# Patient Record
Sex: Female | Born: 1980 | Race: Black or African American | Hispanic: No | Smoking: Current every day smoker
Health system: Southern US, Community
[De-identification: ages and names within clinical notes are randomized; demographics above are authoritative.]

## PROBLEM LIST (undated history)

## (undated) HISTORY — PX: CHOLECYSTECTOMY: SHX55

---

## 2004-05-28 ENCOUNTER — Inpatient Hospital Stay (HOSPITAL_COMMUNITY): Admission: AD | Admit: 2004-05-28 | Discharge: 2004-05-28 | Payer: Self-pay | Admitting: Obstetrics and Gynecology

## 2004-09-16 ENCOUNTER — Ambulatory Visit: Payer: Self-pay | Admitting: Family Medicine

## 2004-09-16 ENCOUNTER — Inpatient Hospital Stay (HOSPITAL_COMMUNITY): Admission: AD | Admit: 2004-09-16 | Discharge: 2004-09-16 | Payer: Self-pay | Admitting: Obstetrics & Gynecology

## 2004-12-16 ENCOUNTER — Ambulatory Visit: Payer: Self-pay | Admitting: *Deleted

## 2004-12-24 ENCOUNTER — Ambulatory Visit (HOSPITAL_COMMUNITY): Admission: RE | Admit: 2004-12-24 | Discharge: 2004-12-24 | Payer: Self-pay | Admitting: *Deleted

## 2004-12-24 ENCOUNTER — Ambulatory Visit: Payer: Self-pay | Admitting: Obstetrics & Gynecology

## 2004-12-31 ENCOUNTER — Ambulatory Visit: Payer: Self-pay | Admitting: *Deleted

## 2005-01-05 ENCOUNTER — Ambulatory Visit (HOSPITAL_COMMUNITY): Admission: RE | Admit: 2005-01-05 | Discharge: 2005-01-05 | Payer: Self-pay | Admitting: *Deleted

## 2005-01-07 ENCOUNTER — Ambulatory Visit: Payer: Self-pay | Admitting: *Deleted

## 2005-01-14 ENCOUNTER — Ambulatory Visit: Payer: Self-pay | Admitting: Obstetrics & Gynecology

## 2005-01-16 ENCOUNTER — Ambulatory Visit: Payer: Self-pay | Admitting: Obstetrics & Gynecology

## 2005-01-16 ENCOUNTER — Inpatient Hospital Stay (HOSPITAL_COMMUNITY): Admission: AD | Admit: 2005-01-16 | Discharge: 2005-01-19 | Payer: Self-pay | Admitting: Obstetrics & Gynecology

## 2005-09-21 IMAGING — US US OB FOLLOW-UP
1 series · 18 of 26 positions shown · non-contrast
Comparison: none

CLINICAL DATA: Assess growth.

[Series 1: us ob re-eval · 18 of 26 slices shown]
[im 1/26]
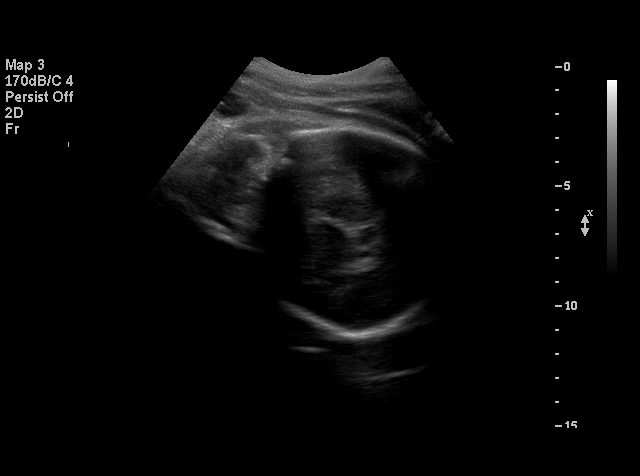
[im 3/26]
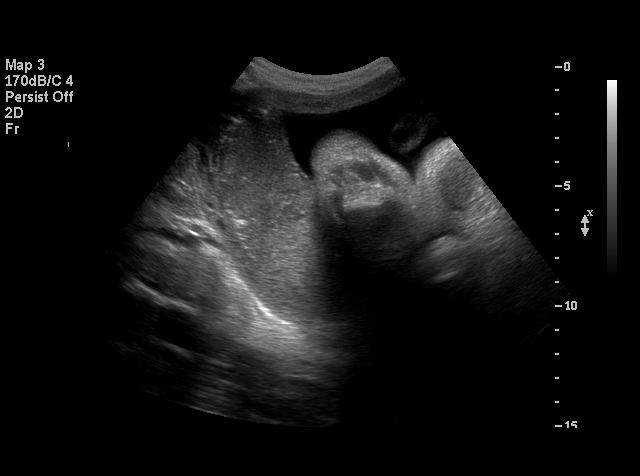
[im 4/26]
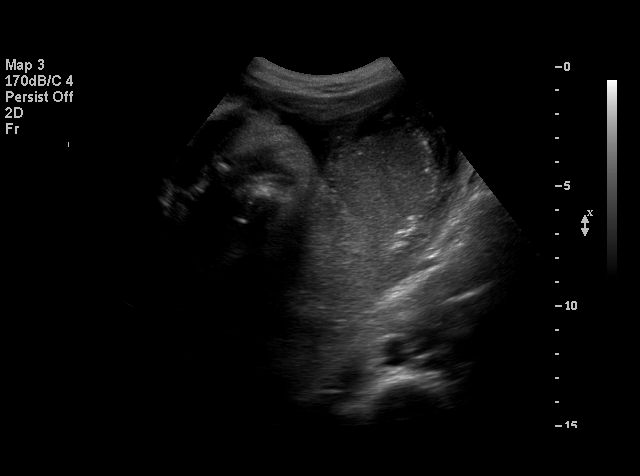
[im 6/26]
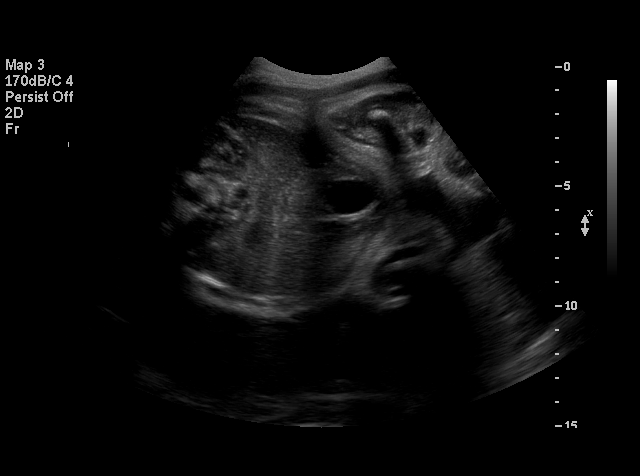
[im 7/26]
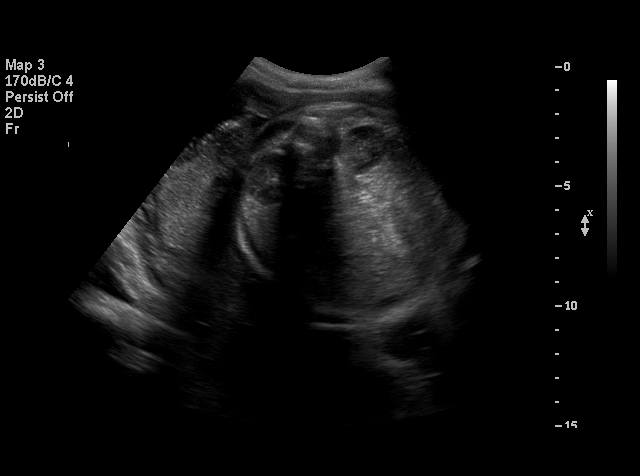
[im 8/26]
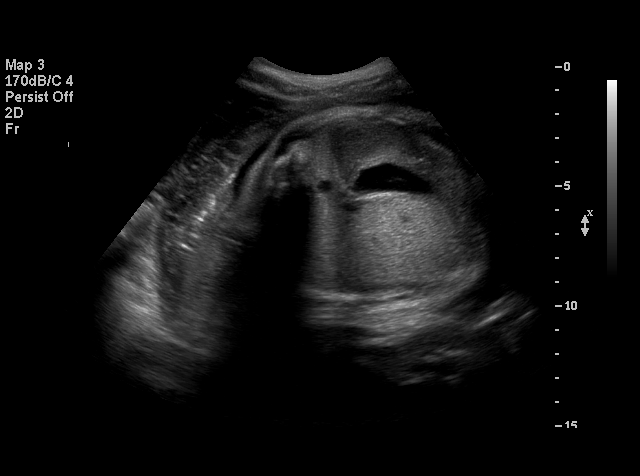
[im 10/26]
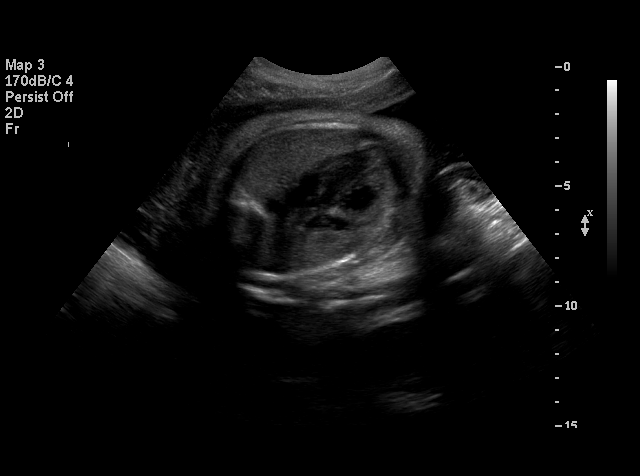
[im 11/26]
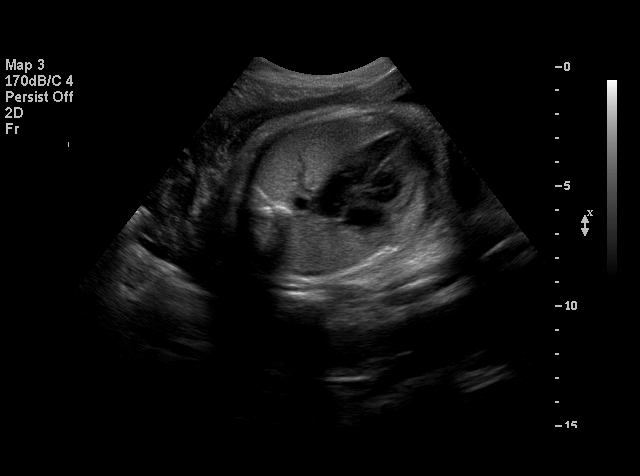
[im 13/26]
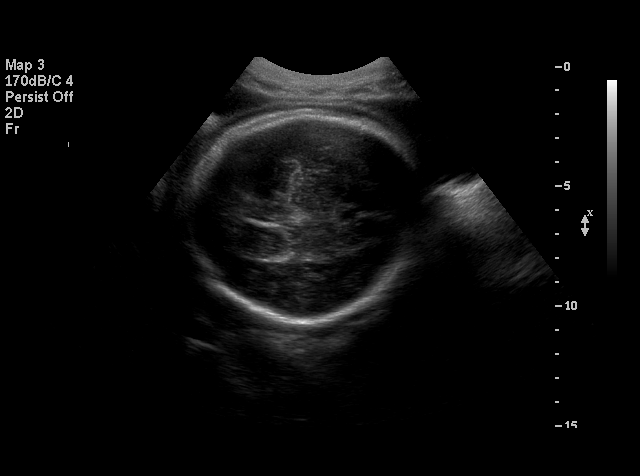
[im 14/26]
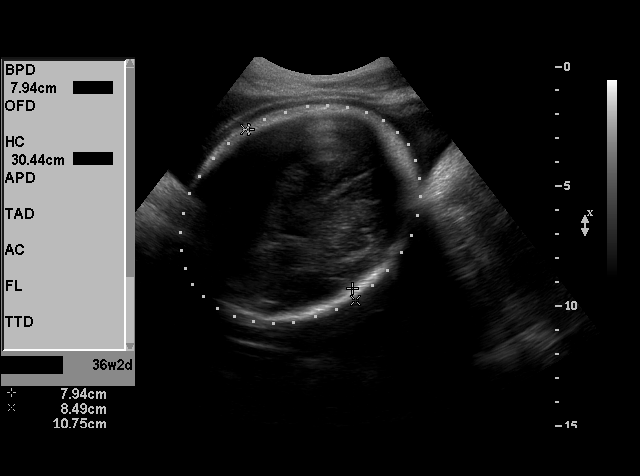
[im 16/26]
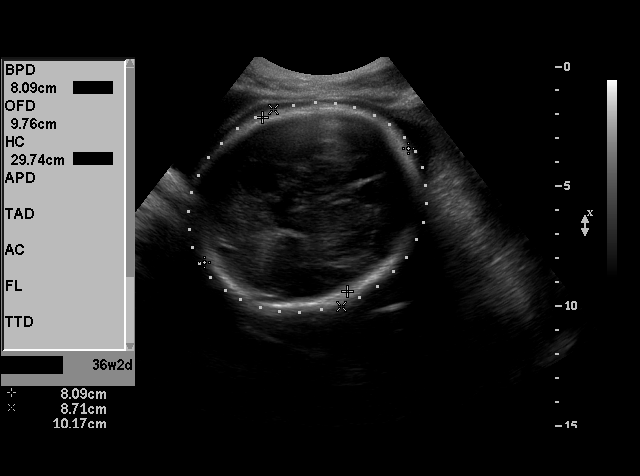
[im 17/26]
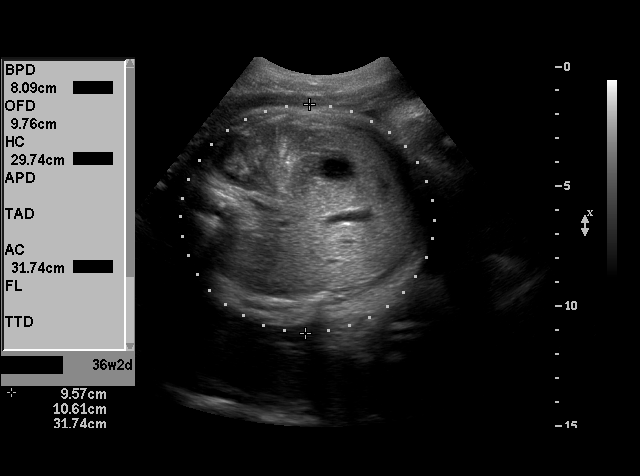
[im 19/26]
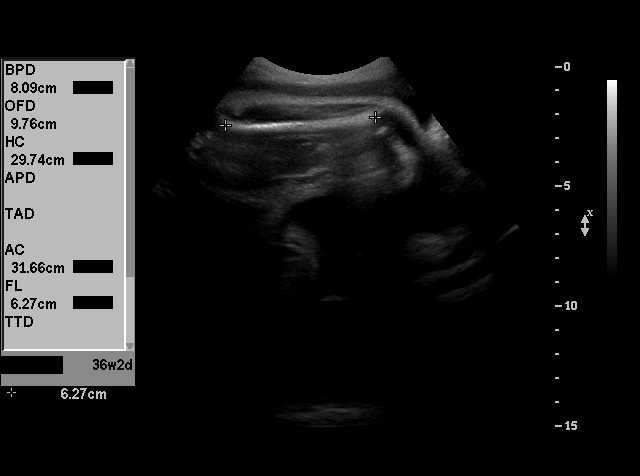
[im 20/26]
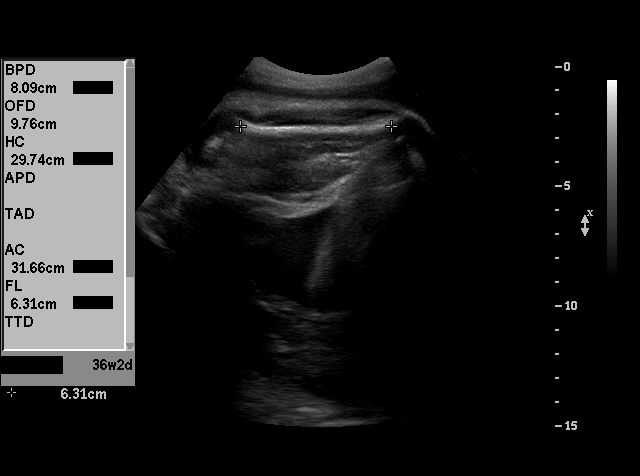
[im 21/26]
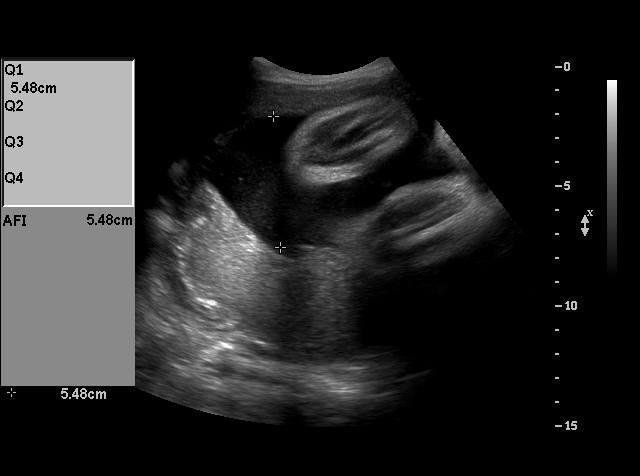
[im 23/26]
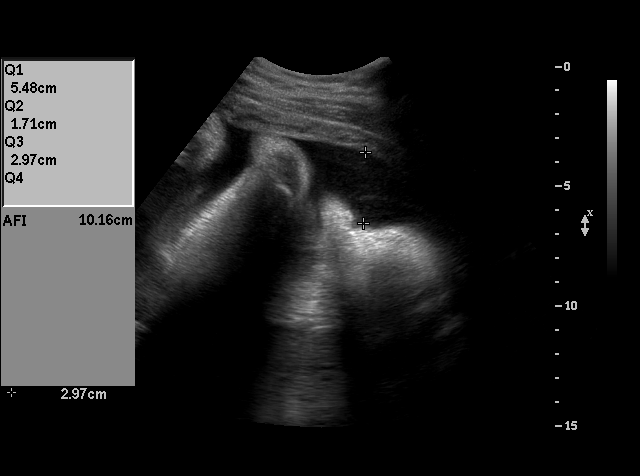
[im 24/26]
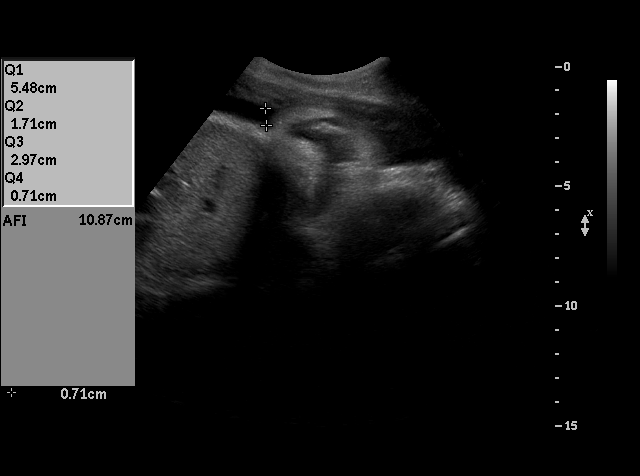
[im 26/26]
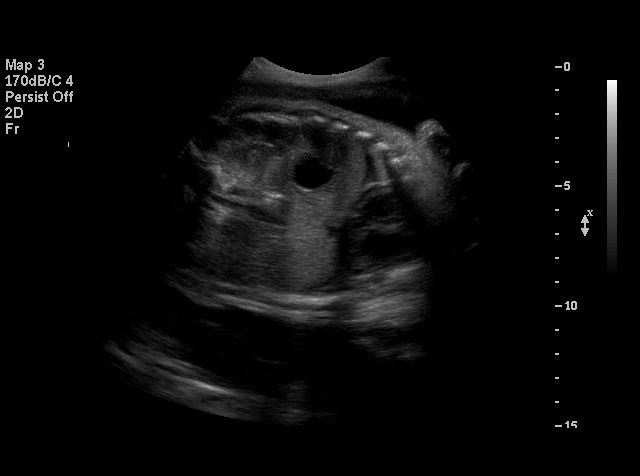

[18 of 26 positions shown; findings below may reference images not displayed]

OBSTETRICAL ULTRASOUND RE-EVALUATION:
Number of Fetuses:  1
Heart Rate:  136
Movement:  Yes
Breathing:  Yes
Presentation:  Cephalic
Placental Location:  Posterior
Grade:  II
Previa:  No
Amniotic Fluid (subjective):  Normal
Amniotic Fluid (objective):  10.9 cm AFI (5th -95th%ile = 7.7 ? 24.9 cm for 36 wks)

FETAL BIOMETRY
BPD:  8.1 cm   32 w 4 d
HC:  30.2 cm   33 w 4 d
AC:  31.2 cm   35 w 2 d
FL:  6.3 cm   32 w 4 d

Mean GA:  33 w 4 d
Assigned GA:  36 w 2 d
Fetal indices are within normal limits.
EFW:  6313 g (H) 10th ? 25th%ile (9890 ? 7224 g) For 36 wks

FETAL ANATOMY
Lateral Ventricles:  Visualized 
Thalami/CSP:  Previously seen 
Posterior Fossa:  Previously seen 
Nuchal Region:  N/A
Spine:  Previously seen 
4 Chamber Heart on Left:  Visualized 
Stomach on Left:  Visualized 
3 Vessel Cord:  Previously seen 
Cord Insertion Site:  Previously seen 
Kidneys:  Visualized 
Bladder:  Visualized 
Extremities:  Previously seen 

MATERNAL UTERINE AND ADNEXAL FINDINGS
Cervix:  Not evaluated
IMPRESSION: 1.  Single intrauterine pregnancy demonstrating an estimated gestational age by ultrasound of 33 weeks and 4 days.  This is 1 week and 5 days behind assigned gestational age of 36 weeks and 2 days.  Currently the estimated fetal weight falls just below the 25th percentile for a 36 week gestation and remains relatively stable since the previous exam suggesting appropriate interval growth.
2.  Subjectively and quantitatively normal amniotic fluid volume.
3.  No late developing fetal anatomic abnormalities are identified associated with the lateral ventricles, four chamber heart, stomach, kidneys, or bladder.

## 2006-01-08 ENCOUNTER — Inpatient Hospital Stay (HOSPITAL_COMMUNITY): Admission: AD | Admit: 2006-01-08 | Discharge: 2006-01-08 | Payer: Self-pay | Admitting: Obstetrics & Gynecology

## 2006-01-20 ENCOUNTER — Inpatient Hospital Stay (HOSPITAL_COMMUNITY): Admission: AD | Admit: 2006-01-20 | Discharge: 2006-01-20 | Payer: Self-pay | Admitting: Obstetrics and Gynecology

## 2006-01-20 ENCOUNTER — Ambulatory Visit: Payer: Self-pay | Admitting: *Deleted

## 2006-01-21 ENCOUNTER — Ambulatory Visit: Payer: Self-pay | Admitting: Family Medicine

## 2006-01-22 ENCOUNTER — Ambulatory Visit: Payer: Self-pay | Admitting: Family Medicine

## 2006-01-28 ENCOUNTER — Ambulatory Visit: Payer: Self-pay | Admitting: Family Medicine

## 2006-01-30 ENCOUNTER — Ambulatory Visit: Payer: Self-pay | Admitting: *Deleted

## 2006-01-30 ENCOUNTER — Observation Stay (HOSPITAL_COMMUNITY): Admission: AD | Admit: 2006-01-30 | Discharge: 2006-01-30 | Payer: Self-pay | Admitting: *Deleted

## 2006-01-30 ENCOUNTER — Inpatient Hospital Stay (HOSPITAL_COMMUNITY): Admission: AD | Admit: 2006-01-30 | Discharge: 2006-02-02 | Payer: Self-pay | Admitting: *Deleted

## 2006-10-16 IMAGING — US US OB LIMITED
1 series · 14 of 27 positions shown · non-contrast
Comparison: none

CLINICAL DATA: 37 weeks pregnant with bleeding and late prenatal care.

[Series 1: us ob limited · 0.35mm/px · 14 of 27 slices shown]
[im 1/27]
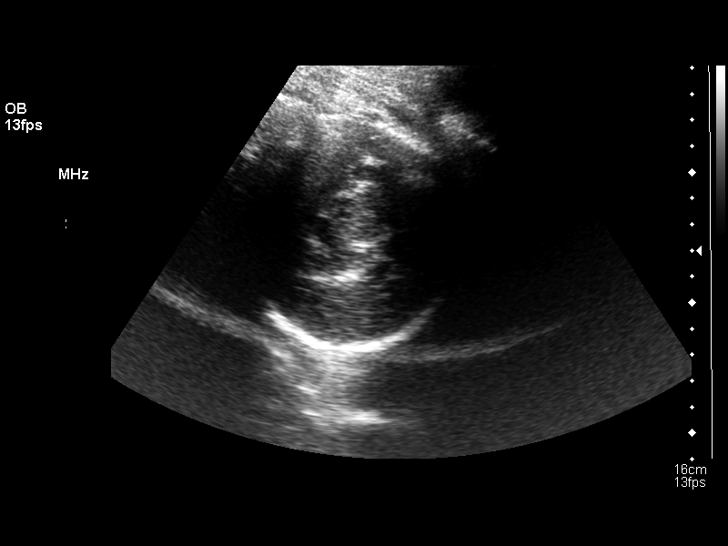
[im 3/27]
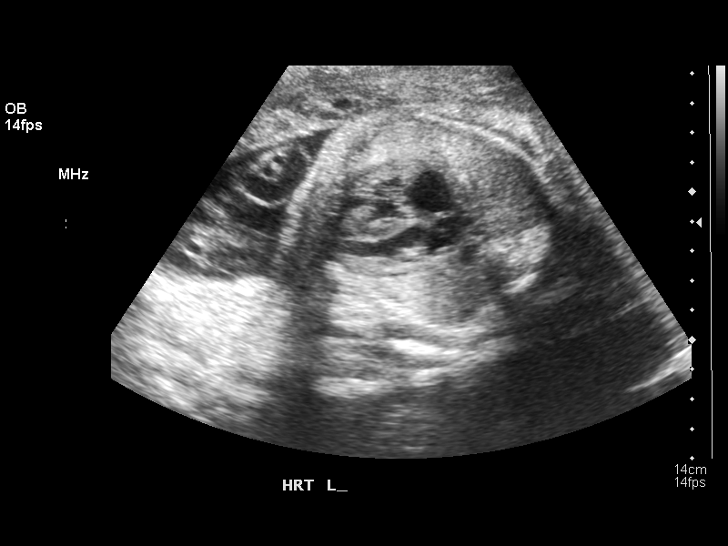
[im 5/27]
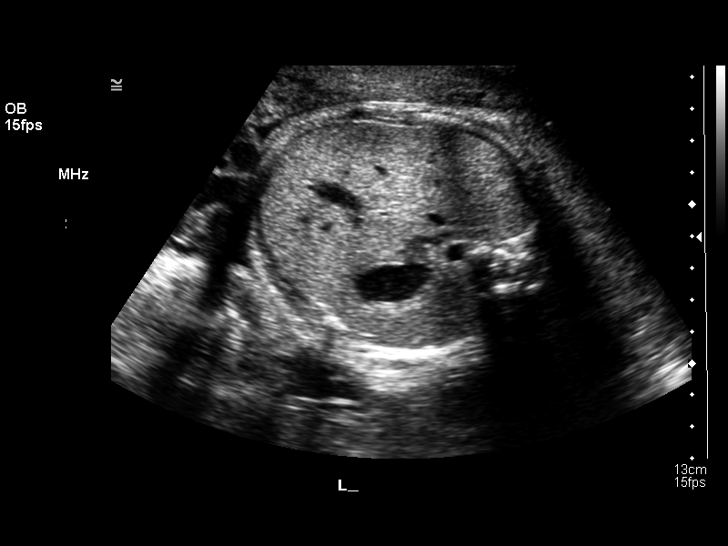
[im 7/27]
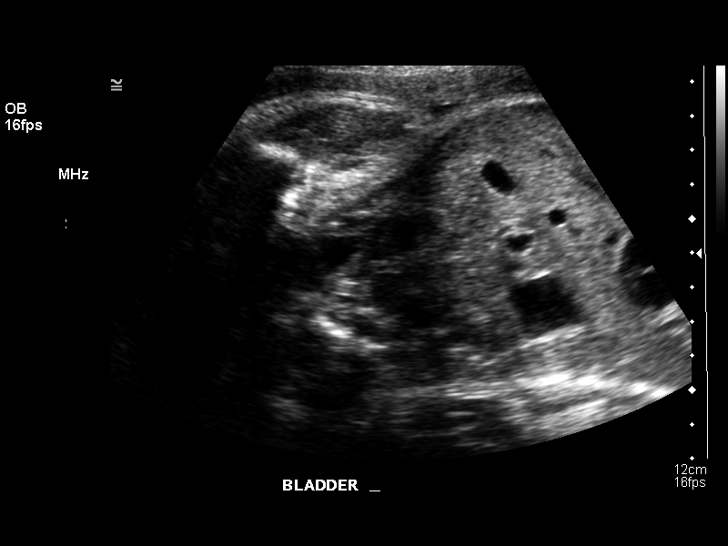
[im 9/27]
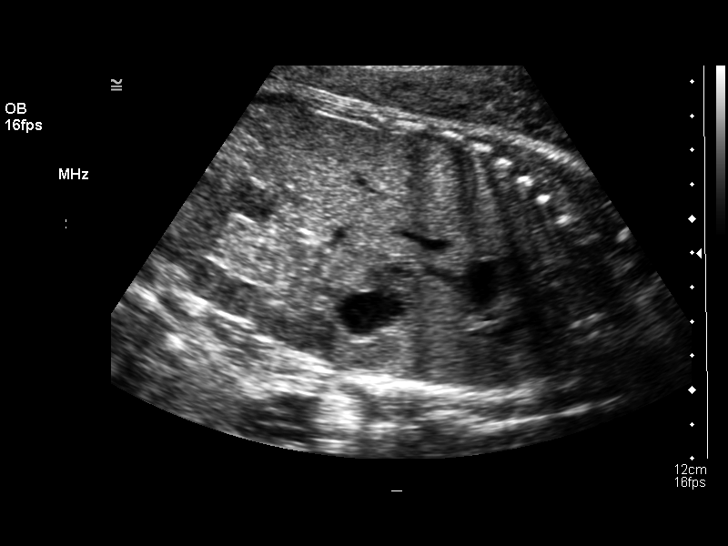
[im 11/27]
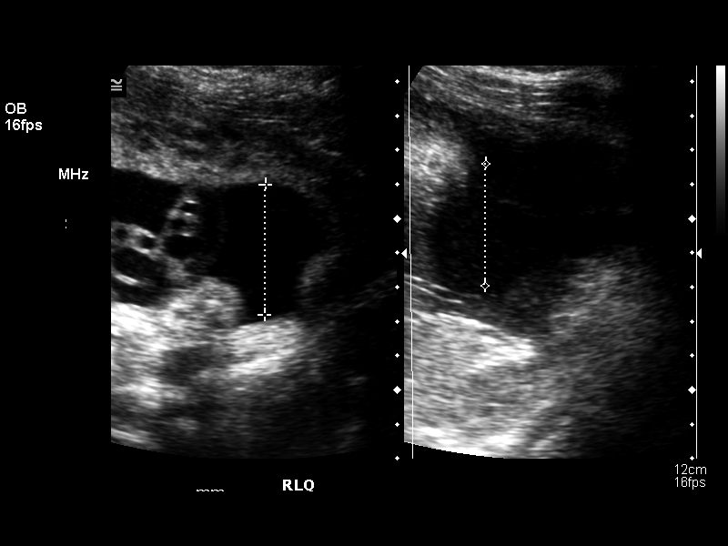
[im 13/27]
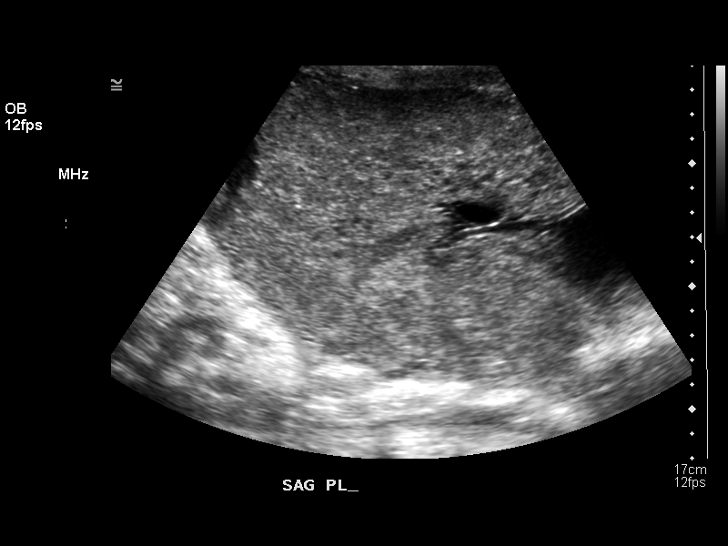
[im 15/27]
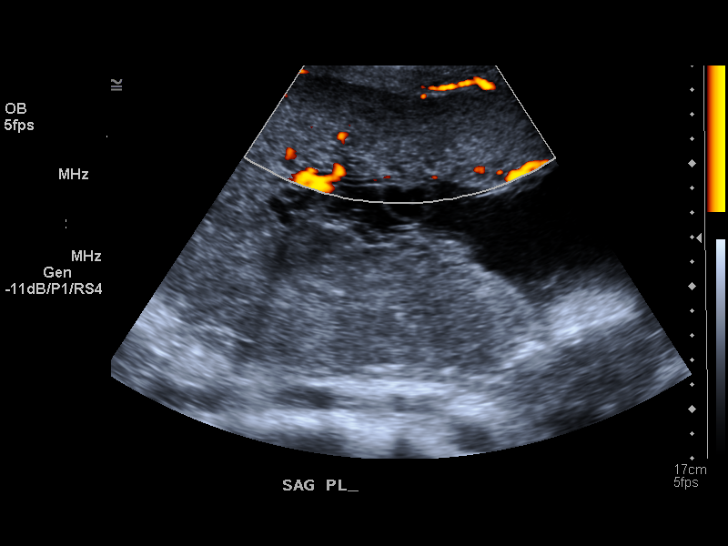
[im 17/27]
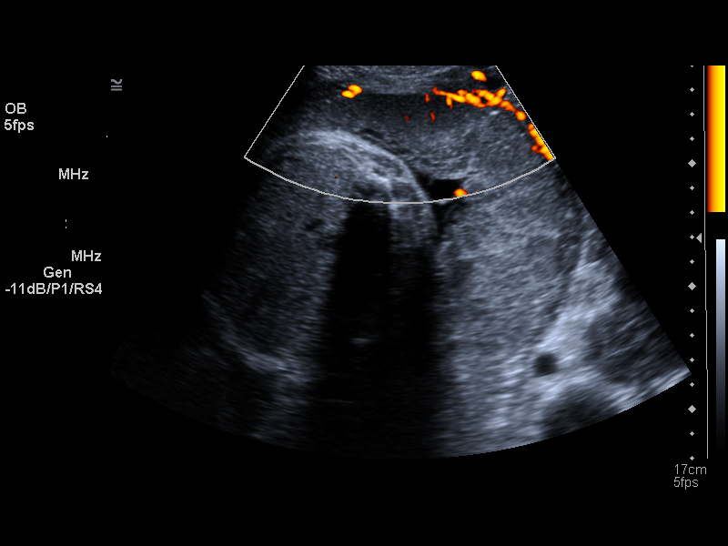
[im 19/27]
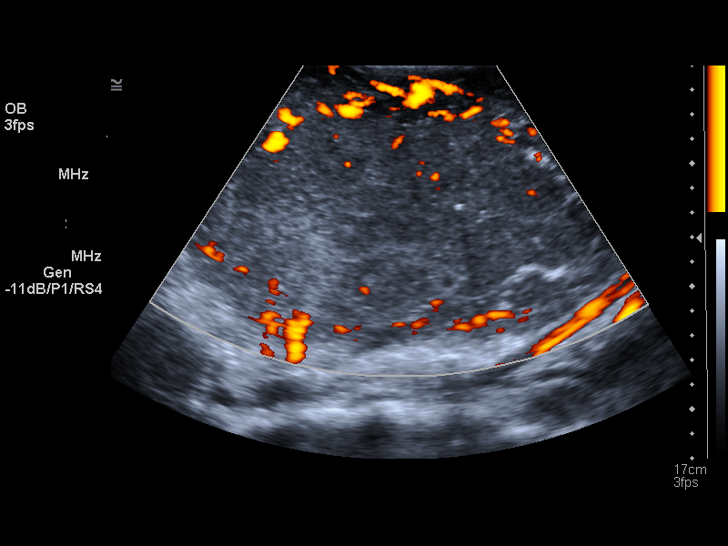
[im 21/27]
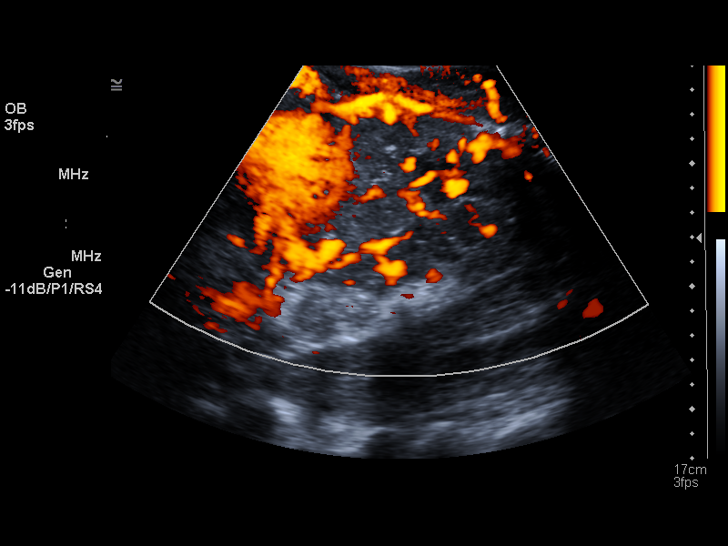
[im 23/27]
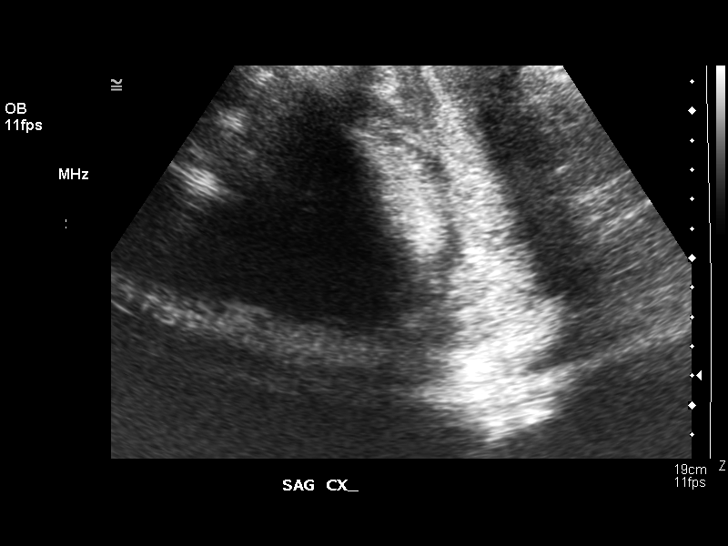
[im 25/27]
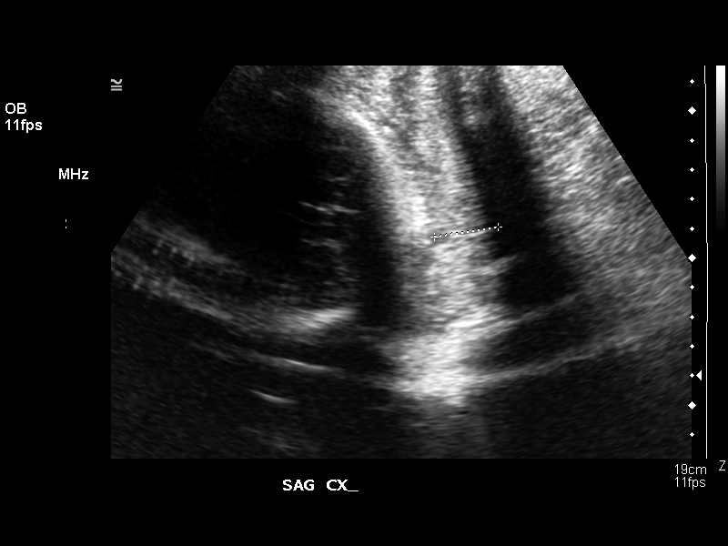
[im 27/27]
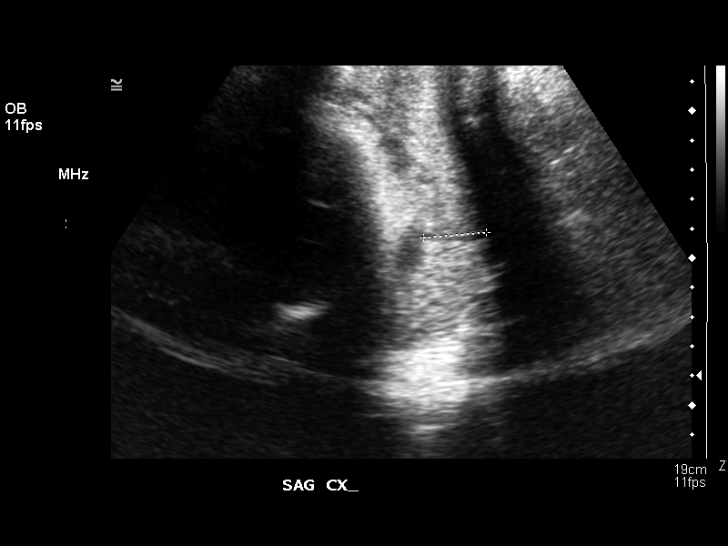

[14 of 27 positions shown; findings below may reference images not displayed]

LIMITED OBSTETRICAL ULTRASOUND:
 Number of Fetuses:  1
 Heart Rate:  144
 Movement:  Yes
 Breathing:  Yes
 Presentation:  Cephalic
 Placental Location:  Left lateral 
 Grade:  II
 Previa:  No
 Amniotic Fluid (Subjective):  Normal
 Amniotic Fluid (Objective):  15.8 cm AFI (5th -95th%ile = 8.1 ? 24.8 cm for 34 wks)

 Fetal measurements and complete anatomic evaluation were not requested.  The following fetal anatomy was visualized during this exam:  Lateral ventricles, four chamber heart, stomach, kidneys, bladder, diaphragm, and female genitalia.

 MATERNAL UTERINE AND ADNEXAL FINDINGS
 Cervix:  2.1 cm Translabially

 BIOPHYSICAL PROFILE

 Movement:  2    Time:  15 minutes
 Breathing:  2
 Tone:  2
 Amniotic Fluid:  2

 Total Score:  8
IMPRESSION: 1.  Single living intrauterine fetus in cephalic presentation with a left lateral grade II placenta and normal amniotic fluid volume.  
 2.  Biophysical profile score [DATE] at 15 minutes.
 3.  Cervical length 2.1 cm translabiallly.

## 2007-08-15 ENCOUNTER — Emergency Department (HOSPITAL_COMMUNITY): Admission: EM | Admit: 2007-08-15 | Discharge: 2007-08-15 | Payer: Self-pay | Admitting: Emergency Medicine

## 2007-09-01 ENCOUNTER — Inpatient Hospital Stay (HOSPITAL_COMMUNITY): Admission: AD | Admit: 2007-09-01 | Discharge: 2007-09-02 | Payer: Self-pay | Admitting: Gynecology

## 2008-04-02 ENCOUNTER — Inpatient Hospital Stay (HOSPITAL_COMMUNITY): Admission: EM | Admit: 2008-04-02 | Discharge: 2008-04-05 | Payer: Self-pay | Admitting: Emergency Medicine

## 2010-03-21 ENCOUNTER — Emergency Department (HOSPITAL_COMMUNITY): Admission: EM | Admit: 2010-03-21 | Discharge: 2010-03-21 | Payer: Self-pay | Admitting: Emergency Medicine

## 2010-11-13 ENCOUNTER — Emergency Department (HOSPITAL_COMMUNITY)
Admission: EM | Admit: 2010-11-13 | Discharge: 2010-11-13 | Payer: Self-pay | Source: Home / Self Care | Admitting: Emergency Medicine

## 2010-11-14 ENCOUNTER — Emergency Department (HOSPITAL_COMMUNITY)
Admission: EM | Admit: 2010-11-14 | Discharge: 2010-11-14 | Payer: Self-pay | Source: Home / Self Care | Admitting: Emergency Medicine

## 2011-01-03 ENCOUNTER — Encounter: Payer: Self-pay | Admitting: *Deleted

## 2011-04-28 NOTE — H&P (Signed)
[name], ELICKER             ACCOUNT NO.:  [id number]   MEDICAL RECORD NO.:  [id number]          PATIENT TYPE:  INP   LOCATION:  A306                          FACILITY:  [facility]   PHYSICIAN:  Dorris Singh, DO    DATE OF BIRTH:  January 30, 1981   DATE OF ADMISSION:  04/02/2008  DATE OF DISCHARGE:  LH                              HISTORY & PHYSICAL   CHIEF COMPLAINT:  Shortness of breath.  The patient has no PCP.   HISTORY OF PRESENT ILLNESS:  The patient is a 30 year old female who  presented to the [facility] emergency room with complaint of shortness  of breath.  She reports a week history of having cold-like symptoms and  this increasing shortness of breath with cough, increasing at night,  nothing relieved and nothing made it better.  She currently is a  Consulting civil engineer, studying for LPN, and stated that she was having a very  difficult time breathing and decided to come to be evaluated through  [facility].  It was found that after several breathing treatments and  given steroids that nothing improved, and at that point in time it was  determined she needs to be admitted.   PAST MEDICAL HISTORY:  1. She had had asthma as a child, has not had any recent exacerbations      for over 10 years.  2. She has had a C-section.   SOCIAL HISTORY:  She has had four children.  She is a smoker of one-pack  every 3 days.  No drinker.  No drug abuse.  She currently just moved  here from [place], and her family practitioner was there, but she is  looking for new practitioner.   CURRENT MEDICATIONS:  She currently is on one medication, Depo-Provera  IM, specialized dosing every 3 months.   ALLERGIES:  NO KNOWN DRUG ALLERGIES.   REVIEW OF SYSTEMS:  Negative for fever or chills or decreased appetite.  Eyes are negative for eye pain or double vision.  Ears: Negative for ear  pain, hearing loss, epistaxis.  Positive for nasal congestion and  negative for sore throat.  CARDIOVASCULAR:  Negative for  chest pain and  palpitations.  RESPIRATORY:  Positive for cough and wheezing and  pleuritic chest pain.  GI: Negative for nausea and vomiting, diarrhea.  GU: Negative for dysuria or hematuria.  MUSCULOSKELETAL:  Negative for  arthralgias or arthritis.  SKIN:  Negative for pruritus or rash.  NEUROLOGICAL:  Negative for headache and confusion.  Psychiatric  negative for anxiety or depression.  HEMATOLOGIC:  Negative for anemia.   PHYSICAL EXAMINATION:  VITAL SIGNS:  Blood pressure is 115/72, pulse  rate is 99, respirations 24, temperature 99.  Pulse ox is 98%.  GENERAL:  The patient is 30 year old [place] female who is well-  developed, well-nourished in no acute distress.  HEENT:  Head is normocephalic, atraumatic.  Eyes very are EOMI, PERRL.  Nose: Turbinates are moist.  Throat: No exudate or erythema.  Teeth are  in fair repair.  NECK:  There is no lymphadenopathy.  HEART:  Regular rate and rhythm.  S1-S2 present.  CHEST:  Symmetrical with movements.  No retractions noted.  RESPIRATORY:  Positive inspiratory and expiratory wheezes in bilateral  lung fields.  ABDOMEN:  Soft, nontender, nondistended.  EXTREMITIES:  Positive pulses.  No ecchymosis, edema or cyanosis.   LABORATORY DATA:  Her labs that were done included none that were done  in the ED.  Her chest x-ray shows no suspicious nodules, previous node  nodular density corresponds to a nipple shadow, bronchitic change.  That  was special view.  She then had a two-view, actual density of the left  base with described repeat nipple markers was warranted.  At that time,  the nurse practitioner from [facility] called telling the patient was not  improving, went ahead and admitted her to the service of [facility].  Also when I went to go see the patient, she had walked off the floor and  had gone to go smoke and has done to get some take-out food.  Talked to  her extensively about her condition and that she cannot smoke while  she  is admitted to the hospital.  The patient stated understanding and  stated that she would not do that.   ASSESSMENT:  1. Acute exacerbation of asthma.  2. Tobacco abuse.   PLAN:  Will admit the patient to service of [facility] to the general  medical floor.  Will start the patient on a DuoNeb nebulizer treatments  every 4 hours plus one hour.  Will also put her on O2 and titrate to  keep her saturations above 92%.  Will saline lock her.  Will do blood  work in the morning and will also check her for sputum for gram stain  and culture.  Will give her steroids and then give her continued IV  steroids as well.  Give her something for nausea as well as for cough.  Will do GI and DVT prophylaxis.  Will continue to follow her and change  therapy as needed.      Dorris Singh, DO  Electronically Signed     CB/MEDQ  D:  04/03/2008  T:  04/03/2008  Job:  [id number]

## 2011-04-28 NOTE — Discharge Summary (Signed)
NAMEPARUL, PORCELLI             ACCOUNT NO.:  [id number]   MEDICAL RECORD NO.:  [id number]          PATIENT TYPE:  INP   LOCATION:  A306                          FACILITY:  [facility]   PHYSICIAN:  Gardiner Barefoot, MD    DATE OF BIRTH:  05/21/1981   DATE OF ADMISSION:  04/03/2008  DATE OF DISCHARGE:  04/05/2008                               DISCHARGE SUMMARY   DIAGNOSES AT DISCHARGE:  1. Acute bronchitis.  2. Tobacco abuse.  3. Drug abuse.   MEDICATIONS AT DISCHARGE:  1. Prednisone 60 mg daily for 5 days.  2. Levaquin 500 mg p.o. daily for 4 more days.  3. Xopenex 2 puffs inhaled p.r.n. for shortness of breath.  4. Nicotine patch, for which the patient is going to get over the      counter.   HISTORY OF PRESENT ILLNESS:  Please see previously dictated history and  physical. This is a 30 year old female with remote history of asthma and  a smoker who came in with a week of cold-like symptoms including cough  and had some shortness of breath. The patient was noted to be wheezing  with x-ray consistent with bronchitic changes.   HOSPITAL COURSE:  1. Acute bronchitis. The patient was admitted and started on Levaquin      p.o. as well as IV steroids. The patient improved with improved      breathing and improved air entry as well as the patient was able to      breathe off oxygen during her hospitalization. The patient has not      had a recent episode of bronchitis or wheezing like this in the      recent past, and it was emphasized to the patient during her      hospitalization that she will absolutely need to quit smoking. The      patient was offered help and was given a patch during her      hospitalization here, and the patient reported that she was      motivated to obtain an over-the-counter patch at discharge. The      patient was discharged in good condition off oxygen and tolerating      p.o. therapy.  2. Tobacco abuse. As above, the patient was motivated to quit and was      to start on nicotine patches.   DISPOSITION:  The patient is to find a primary care physician and follow  up with him or her in the next week. Of note, the patient did have  difficulty staying in her room, and she was found leaving quite  frequently. At one point, she had left, and a urine drug screen was done  when she returned. It was positive for opiates as well as cannabis. The  patient was not receiving any opioid medication during her hospital stay  so the concern was for illicit substance. This was brought up with the  patient, and she was required to stay in her room. She was seen by a  Child psychotherapist who offered counselling.     Gardiner Barefoot, MD  Electronically Signed    RWC/MEDQ  D:  04/05/2008  T:  04/05/2008  Job:  [id number]

## 2011-04-28 NOTE — Group Therapy Note (Signed)
[name], GIOVANNETTI             ACCOUNT NO.:  [id number]   MEDICAL RECORD NO.:  [id number]          PATIENT TYPE:  INP   LOCATION:  A306                          FACILITY:  [facility]   PHYSICIAN:  Dorris Singh, DO    DATE OF BIRTH:  1981/11/04   DATE OF PROCEDURE:  04/04/2008  DATE OF DISCHARGE:                                 PROGRESS NOTE   The patient seen today stating that she still is not feeling any better.  Discussed with her regarding her activities during the day.  Apparently  she has left several times to go downstairs with her pocketbook, either  to give information to family members, but she as  left several times  and there was some concern with her return, whether or not she would  come back very shaky, so they went and did a urine drug screen which was  positive for opiates as well as cannabis.  Discussed this with the  patient in the sense that she needed to stay in her room and that her  asthma was not getting any better.  She was not improving with her  breathing and if the family members needed to see her, they needed to  come up and see her  and that the fact that she has conversational  dyspnea, that she probably needs to stay off the phone and limit her  phone call so that she can improve.   PHYSICAL EXAMINATION:  VITAL SIGNS: Her vital signs are stable.  She is  afebrile and her current pulse oximetry is 99% on  3 L.  GENERAL:  This is a 30 year old [place] female who is well-  developed, well-nourished and she is currently in mild distress.  HEART:  Regular rate and rhythm.  LUNGS:  Lungs are clear to auscultation bilaterally.  ABDOMEN:  Abdomen is soft, nontender, nondistended.  EXTREMITIES:  Positive pulses.  No cyanosis, edema or ecchymosis.   Her labs are pending for today.   ASSESSMENT/PLAN:  1. Acute exacerbation of asthma.  2. Tobacco abuse.  3. Cannabis abuse.   PLAN:  1. Will have case management come and talk with the patient  regarding      possible nebulizer as well as asking her to stay in her room and      not walk the halls or go downstairs because this is not helping her      condition.  I discussed this at length with the patient and 20      minutes talking to her regarding this.  Will increase her      Nicoderm patch and because she is not improved, will also do round      the clock nebs and I anticipate her staying until the April 23.      She has remained afebrile.  At this point in time I do not feel      there is a need to repeat a chest x-ray, but if she is not      improving, would recommend on the April 23 that a chest x-ray be  done.      Dorris Singh, DO  Electronically Signed     CB/MEDQ  D:  04/04/2008  T:  04/04/2008  Job:  [id number]

## 2011-04-28 NOTE — Consult Note (Signed)
[name], [name]             ACCOUNT NO.:  [id number]   MEDICAL RECORD NO.:  [id number]          PATIENT TYPE:  INP   LOCATION:  A306                          FACILITY:  [facility]   PHYSICIAN:  Gardiner Barefoot, MD    DATE OF BIRTH:  1981/08/04   DATE OF CONSULTATION:  04/05/2008  DATE OF DISCHARGE:                                 CONSULTATION   SUBJECTIVE:  The patient continues to feel short of breath and is  complaining of subjective wheezing.  The patient does report she feels  better on oxygen.   PHYSICAL EXAM:  VITAL SIGNS:  Temperature is 99.0, pulse is 96,  respirations 16, blood pressure is 121/62.  O2 saturation is 99% on 2 L  nasal cannula.  GENERAL:  The patient is awake and alert and appears in mild distress.  CARDIOVASCULAR:  Regular rate and rhythm.  No murmurs, rubs or gallops.  LUNGS:  With good air entry, mild diffuse wheezing.  ABDOMEN:  Soft, nontender, nondistended, positive bowel sounds, no  hepatosplenomegaly.  EXTREMITIES:  No cyanosis, clubbing or edema.   No new laboratory data.  WBC from yesterday is 25 with 94% neutrophils.  Drug screen also notable for opiates and cannabis.   ASSESSMENT AND PLAN:  1. Bronchitis.  The patient is a smoker and was counseled on smoking      cessation.  At this time she does appear to be able to hold her      saturations and she is saturating in the high 90s on nasal cannula.      We will wean her nasal cannula to make sure that she is able to      maintain her saturations on room air.  We will continue with the      Levaquin and we will transition her to p.o. steroids.  I do      anticipate she will be able to go home today or tomorrow.  2. Leukocytosis.  The patient's white count is 25, which is unusually      high for just a steroid-induced reaction.  The patient does feel      warm, although there is no report of any fever today.  We will      recheck her CBC today to assure that it is improving; however, we  will check a chest x-ray is it continues to be any higher.  No      other symptoms, including no diarrhea, reported.  3. Smoking cessation.  The patient is going to use the Nicoderm patch      as an outpatient, which she can get over-the-counter.   DISPOSITION:  Anticipate discharge today or tomorrow.      Gardiner Barefoot, MD  Electronically Signed     RWC/MEDQ  D:  04/05/2008  T:  04/05/2008  Job:  [id number]

## 2011-05-01 NOTE — Discharge Summary (Signed)
[name], [name]             ACCOUNT NO.:  [id number]   MEDICAL RECORD NO.:  [id number]          PATIENT TYPE:  INP   LOCATION:  9165                          FACILITY:  [facility]   PHYSICIAN:  Conni Elliot, M.D.DATE OF BIRTH:  January 29, 1981   DATE OF ADMISSION:  01/30/2006  DATE OF DISCHARGE:  01/30/2006                                 DISCHARGE SUMMARY   ADMISSION DIAGNOSES:  1.  Intrauterine pregnancy at 34-5/7 weeks by third trimester ultrasound.  2.  Apparent onset of labor.  3.  Questionable vaginal bleeding.   DISCHARGE DIAGNOSES:  1.  Intrauterine pregnancy at 34-5/7 weeks.  2.  False labor.   PROCEDURE:  Ultrasound on January 30, 2006 showed a single intrauterine  pregnancy in cephalic presentation. No placenta previa, grade 2 placenta.  Amniotic fluid 15.8 cm. Cervix length 2.1 cm. BPP was 8 out of 8. Sex is  female.   LABORATORY DATA:  Urine drug screen was negative. GBS was negative. White  blood cell count 12,100, hemoglobin 10.7, hematocrit 33.2, platelet count  269,000.   HOSPITAL COURSE:  The patient is a 30 year old G4, P2, 0, 1, 0, 3 with poor  dating. The patient's dating includes 34-5/7 weeks by third trimester  ultrasound, 43-2/7 weeks by last menstrual period, and 40-2/7 weeks which  was given to the patient by a physician in [place]. The patient presented  with questionable small amount of blood with mucus and contracting every 2  to 3 minutes. She was found to be 5 cm dilated, 90% and -2 station on  spontaneous vaginal exam and was admitted in apparent active labor. The  patient's contractions subsequently slowed down to one every 10 to 20  minutes and repeat spontaneous vaginal exam by Dr. Gavin Potters was 2 to 3 cm,  50%, and -2. The patient had an ultrasound performed, given the history of  vaginal bleeding which showed an AFI of 15.8, a normal grade 2 placenta and  an 8 out of 8 BPP with cervix length of 2.1. Fetal heart tones were  reassuring  throughout the hospital course. As the patient was not in active  labor, she was discharged home with instructions to follow up at the high  risk clinic for her next scheduled appointment.   DISCHARGE DIET:  Regular.   DISCHARGE ACTIVITIES:  Normal.   MEDICATIONS:  Prenatal vitamins.   FOLLOW UP:  Appointment at high risk clinic as previously scheduled.      Benn Moulder, M.D.    ______________________________  Conni Elliot, M.D.    MR/MEDQ  D:  01/30/2006  T:  01/30/2006  Job:  [id number]

## 2011-09-08 LAB — CBC
HCT: 32.3 — ABNORMAL LOW
HCT: 34.2 — ABNORMAL LOW
HCT: 36.4
Hemoglobin: 10.8 — ABNORMAL LOW
Hemoglobin: 11.2 — ABNORMAL LOW
Hemoglobin: 12
MCHC: 32.6
MCHC: 33
MCHC: 33.4
MCV: 74.1 — ABNORMAL LOW
MCV: 74.1 — ABNORMAL LOW
MCV: 74.2 — ABNORMAL LOW
Platelets: 276
Platelets: 280
Platelets: 314
RBC: 4.36
RBC: 4.61
RBC: 4.91
RDW: 13.9
RDW: 13.9
RDW: 14.1
WBC: 10.5
WBC: 22.9 — ABNORMAL HIGH
WBC: 25.4 — ABNORMAL HIGH

## 2011-09-08 LAB — DIFFERENTIAL
Basophils Absolute: 0
Basophils Absolute: 0
Basophils Absolute: 0.1
Basophils Relative: 0
Basophils Relative: 0
Basophils Relative: 0
Eosinophils Absolute: 0
Eosinophils Absolute: 0
Eosinophils Absolute: 0
Eosinophils Relative: 0
Eosinophils Relative: 0
Eosinophils Relative: 0
Lymphocytes Relative: 4 — ABNORMAL LOW
Lymphocytes Relative: 5 — ABNORMAL LOW
Lymphocytes Relative: 9 — ABNORMAL LOW
Lymphs Abs: 0.9
Lymphs Abs: 1
Lymphs Abs: 1
Monocytes Absolute: 0.2
Monocytes Absolute: 0.6
Monocytes Absolute: 0.7
Monocytes Relative: 2 — ABNORMAL LOW
Monocytes Relative: 3
Monocytes Relative: 3
Neutro Abs: 21.2 — ABNORMAL HIGH
Neutro Abs: 23.8 — ABNORMAL HIGH
Neutro Abs: 9.3 — ABNORMAL HIGH
Neutrophils Relative %: 89 — ABNORMAL HIGH
Neutrophils Relative %: 92 — ABNORMAL HIGH
Neutrophils Relative %: 94 — ABNORMAL HIGH

## 2011-09-08 LAB — URINALYSIS, ROUTINE W REFLEX MICROSCOPIC
Bilirubin Urine: NEGATIVE
Glucose, UA: NEGATIVE
Hgb urine dipstick: NEGATIVE
Ketones, ur: NEGATIVE
Nitrite: NEGATIVE
Protein, ur: NEGATIVE
Specific Gravity, Urine: 1.015
Urobilinogen, UA: 0.2
pH: 6

## 2011-09-08 LAB — BASIC METABOLIC PANEL
BUN: 11
BUN: 6
CO2: 23
CO2: 24
Calcium: 8.7
Calcium: 9.4
Chloride: 109
Chloride: 111
Creatinine, Ser: 0.73
Creatinine, Ser: 0.94
GFR calc Af Amer: >60
GFR calc Af Amer: >60
GFR calc non Af Amer: >60
GFR calc non Af Amer: >60
Glucose, Bld: 169 — ABNORMAL HIGH
Glucose, Bld: 173 — ABNORMAL HIGH
Potassium: 3.8
Potassium: 3.8
Sodium: 138
Sodium: 140

## 2011-09-08 LAB — PROTIME-INR
INR: 1
Prothrombin Time: 14

## 2011-09-08 LAB — RAPID URINE DRUG SCREEN, HOSP PERFORMED
Amphetamines: NOT DETECTED
Barbiturates: NOT DETECTED
Benzodiazepines: NOT DETECTED
Cocaine: NOT DETECTED
Opiates: POSITIVE — AB
Tetrahydrocannabinol: POSITIVE — AB

## 2011-09-08 LAB — APTT: aPTT: 44 — ABNORMAL HIGH

## 2011-09-24 LAB — URINALYSIS, ROUTINE W REFLEX MICROSCOPIC
Bilirubin Urine: NEGATIVE
Glucose, UA: NEGATIVE
Hgb urine dipstick: NEGATIVE
Ketones, ur: NEGATIVE
Nitrite: NEGATIVE
Protein, ur: NEGATIVE
Specific Gravity, Urine: 1.025
Urobilinogen, UA: 0.2
pH: 6

## 2011-09-24 LAB — URINE MICROSCOPIC-ADD ON: RBC / HPF: NONE SEEN

## 2011-09-25 LAB — RAPID STREP SCREEN (MED CTR MEBANE ONLY): Streptococcus, Group A Screen (Direct): NEGATIVE

## 2019-05-12 ENCOUNTER — Other Ambulatory Visit: Payer: Self-pay

## 2019-05-12 DIAGNOSIS — F1410 Cocaine abuse, uncomplicated: Secondary | ICD-10-CM | POA: Insufficient documentation

## 2019-05-12 DIAGNOSIS — F1910 Other psychoactive substance abuse, uncomplicated: Secondary | ICD-10-CM | POA: Insufficient documentation

## 2019-05-12 LAB — ETHANOL: Alcohol, Ethyl (B): <10 mg/dL (ref <10)

## 2019-05-12 NOTE — ED Triage Notes (Signed)
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 Last use 2 hours ago. Patient denies pain and all other complaints.

## 2019-05-13 ENCOUNTER — Emergency Department
Admission: EM | Admit: 2019-05-13 | Discharge: 2019-05-13 | Disposition: A | Discharge status: Home / Self Care | Payer: Self-pay | Attending: Emergency Medicine | Admitting: Emergency Medicine

## 2019-05-13 DIAGNOSIS — F1910 Other psychoactive substance abuse, uncomplicated: Secondary | ICD-10-CM

## 2019-05-13 LAB — URINALYSIS, COMPLETE (UACMP) WITH MICROSCOPIC
Bacteria, UA: NONE SEEN
Bilirubin Urine: NEGATIVE
Glucose, UA: NEGATIVE mg/dL
Ketones, ur: NEGATIVE mg/dL
Nitrite: NEGATIVE
Protein, ur: 30 mg/dL — AB
Specific Gravity, Urine: 1.029 (ref 1.005–1.030)
pH: 5 (ref 5.0–8.0)

## 2019-05-13 LAB — CBC WITH DIFFERENTIAL/PLATELET
Abs Immature Granulocytes: 0.03 10*3/uL (ref 0.00–0.07)
Basophils Absolute: 0 10*3/uL (ref 0.0–0.1)
Basophils Relative: 1 %
Eosinophils Absolute: 0.3 10*3/uL (ref 0.0–0.5)
Eosinophils Relative: 3 %
HCT: 32.4 % — ABNORMAL LOW (ref 36.0–46.0)
Hemoglobin: 9 g/dL — ABNORMAL LOW (ref 12.0–15.0)
Immature Granulocytes: 0 %
Lymphocytes Relative: 38 %
Lymphs Abs: 3.2 10*3/uL (ref 0.7–4.0)
MCH: 16.7 pg — ABNORMAL LOW (ref 26.0–34.0)
MCHC: 27.8 g/dL — ABNORMAL LOW (ref 30.0–36.0)
MCV: 60.2 fL — ABNORMAL LOW (ref 80.0–100.0)
Monocytes Absolute: 0.7 10*3/uL (ref 0.1–1.0)
Monocytes Relative: 9 %
Neutro Abs: 4 10*3/uL (ref 1.7–7.7)
Neutrophils Relative %: 49 %
Platelets: 577 10*3/uL — ABNORMAL HIGH (ref 150–400)
RBC: 5.38 MIL/uL — ABNORMAL HIGH (ref 3.87–5.11)
RDW: 22.1 % — ABNORMAL HIGH (ref 11.5–15.5)
Smear Review: NORMAL
WBC: 8.2 10*3/uL (ref 4.0–10.5)
nRBC: 0 % (ref 0.0–0.2)

## 2019-05-13 LAB — COMPREHENSIVE METABOLIC PANEL
ALT: 33 U/L (ref 0–44)
AST: 32 U/L (ref 15–41)
Albumin: 4 g/dL (ref 3.5–5.0)
Alkaline Phosphatase: 55 U/L (ref 38–126)
Anion gap: 8 (ref 5–15)
BUN: 15 mg/dL (ref 6–20)
CO2: 24 mmol/L (ref 22–32)
Calcium: 9.3 mg/dL (ref 8.9–10.3)
Chloride: 106 mmol/L (ref 98–111)
Creatinine, Ser: 0.79 mg/dL (ref 0.44–1.00)
GFR calc Af Amer: >60 mL/min (ref >60)
GFR calc non Af Amer: >60 mL/min (ref >60)
Glucose, Bld: 101 mg/dL — ABNORMAL HIGH (ref 70–99)
Potassium: 3.8 mmol/L (ref 3.5–5.1)
Sodium: 138 mmol/L (ref 135–145)
Total Bilirubin: 0.2 mg/dL — ABNORMAL LOW (ref 0.3–1.2)
Total Protein: 8.5 g/dL — ABNORMAL HIGH (ref 6.5–8.1)

## 2019-05-13 LAB — URINE DRUG SCREEN, QUALITATIVE (ARMC ONLY)
Amphetamines, Ur Screen: NOT DETECTED
Barbiturates, Ur Screen: NOT DETECTED
Benzodiazepine, Ur Scrn: NOT DETECTED
Cannabinoid 50 Ng, Ur ~~LOC~~: NOT DETECTED
Cocaine Metabolite,Ur ~~LOC~~: POSITIVE — AB
MDMA (Ecstasy)Ur Screen: NOT DETECTED
Methadone Scn, Ur: NOT DETECTED
Opiate, Ur Screen: NOT DETECTED
Phencyclidine (PCP) Ur S: NOT DETECTED
Tricyclic, Ur Screen: NOT DETECTED

## 2019-05-13 LAB — SALICYLATE LEVEL: Salicylate Lvl: <7 mg/dL (ref 2.8–30.0)

## 2019-05-13 LAB — ACETAMINOPHEN LEVEL: Acetaminophen (Tylenol), Serum: <10 ug/mL — ABNORMAL LOW (ref 10–30)

## 2019-05-13 LAB — PREGNANCY, URINE: Preg Test, Ur: NEGATIVE

## 2019-05-13 NOTE — ED Notes (Signed)
Per request of ER MD, writer provided the pt. with information and instructions on how to access Outpatient [facility] & Substance Abuse Treatment ([facility], [facility] and [employer])

## 2019-05-13 NOTE — ED Provider Notes (Signed)
[facility] Emergency Department Provider Note    First MD Initiated Contact with Patient 05/13/19 0255     (approximate)  I have reviewed the triage vital signs and the nursing notes.   HISTORY  Chief Complaint Drug Problem    HPI [name] is a 38 y.o. female presents to the emergency department with request for detox from methamphetamines and cocaine.  Patient states last use was at 9:00 PM tonight which patient states at that time she smoked crack cocaine.  Patient denies any previous detox treatment.         History reviewed. No pertinent past medical history.  There are no active problems to display for this patient.   Past Surgical History:  Procedure Laterality Date   CHOLECYSTECTOMY      Prior to Admission medications   Not on File    Allergies Patient has no known allergies.  No family history on file.  Social History Social History   Tobacco Use   Smoking status: Current Every Day Smoker   Smokeless tobacco: Never Used  Substance Use Topics   Alcohol use: Yes   Drug use: Yes    Review of Systems Constitutional: No fever/chills Eyes: No visual changes. ENT: No sore throat. Cardiovascular: Denies chest pain. Respiratory: Denies shortness of breath. Gastrointestinal: No abdominal pain.  No nausea, no vomiting.  No diarrhea.  No constipation. Genitourinary: Negative for dysuria. Musculoskeletal: Negative for neck pain.  Negative for back pain. Integumentary: Negative for rash. Neurological: Negative for headaches, focal weakness or numbness. Psychiatric:  Positive for polysubstance abuse  ____________________________________________   PHYSICAL EXAM:  VITAL SIGNS: ED Triage Vitals  Enc Vitals Group     BP 05/12/19 2325 114/77     Pulse Rate 05/12/19 2325 80     Resp 05/12/19 2325 19     Temp 05/12/19 2325 98.4 F (36.9 C)     Temp src --      SpO2 05/12/19 2325 100 %     Weight 05/12/19 2327  65.8 kg (145 lb)     Height 05/12/19 2327 1.549 m (5\' 1" )     Head Circumference --      Peak Flow --      Pain Score 05/12/19 2327 0     Pain Loc --      Pain Edu? --      Excl. in GC? --     Constitutional: Alert and oriented. Well appearing and in no acute distress. Eyes: Conjunctivae are normal.  Mouth/Throat: Mucous membranes are moist.  Oropharynx non-erythematous. Neck: No stridor.   Cardiovascular: Normal rate, regular rhythm. Good peripheral circulation. Grossly normal heart sounds. Respiratory: Normal respiratory effort.  No retractions. No audible wheezing. Gastrointestinal: Soft and nontender. No distention.  Musculoskeletal: No lower extremity tenderness nor edema. No gross deformities of extremities. Neurologic:  Normal speech and language. No gross focal neurologic deficits are appreciated.  Skin:  Skin is warm, dry and intact. No rash noted. Psychiatric: Mood and affect are normal. Speech and behavior are normal.  ____________________________________________   LABS (all labs ordered are listed, but only abnormal results are displayed)  Labs Reviewed  CBC WITH DIFFERENTIAL/PLATELET - Abnormal; Notable for the following components:      Result Value   RBC 5.38 (*)    Hemoglobin 9.0 (*)    HCT 32.4 (*)    MCV 60.2 (*)    MCH 16.7 (*)    MCHC 27.8 (*)  RDW 22.1 (*)    Platelets 577 (*)    All other components within normal limits  COMPREHENSIVE METABOLIC PANEL - Abnormal; Notable for the following components:   Glucose, Bld 101 (*)    Total Protein 8.5 (*)    Total Bilirubin 0.2 (*)    All other components within normal limits  ACETAMINOPHEN LEVEL - Abnormal; Notable for the following components:   Acetaminophen (Tylenol), Serum <10 (*)    All other components within normal limits  URINE DRUG SCREEN, QUALITATIVE ([facility] ONLY) - Abnormal; Notable for the following components:   Cocaine Metabolite,Ur Mayes POSITIVE (*)    All other components within normal  limits  URINALYSIS, COMPLETE (UACMP) WITH MICROSCOPIC - Abnormal; Notable for the following components:   Color, Urine YELLOW (*)    APPearance HAZY (*)    Hgb urine dipstick MODERATE (*)    Protein, ur 30 (*)    Leukocytes,Ua MODERATE (*)    All other components within normal limits  ETHANOL  SALICYLATE LEVEL  PREGNANCY, URINE       Procedures   ____________________________________________   INITIAL IMPRESSION / MDM / ASSESSMENT AND PLAN / ED COURSE  As part of my medical decision making, I reviewed the following data within the electronic MEDICAL RECORD NUMBER   38 year old female presenting with history of polysubstance abuse crack cocaine and methamphetamine requesting detox.  Awaiting psychiatry consultation.  *[name] was evaluated in Emergency Department on 05/13/2019 for the symptoms described in the history of present illness. She was evaluated in the context of the global COVID-19 pandemic, which necessitated consideration that the patient might be at risk for infection with the SARS-CoV-2 virus that causes COVID-19. Institutional protocols and algorithms that pertain to the evaluation of patients at risk for COVID-19 are in a state of rapid change based on information released by regulatory bodies including the CDC and federal and state organizations. These policies and algorithms were followed during the patient's care in the ED.  Some ED evaluations and interventions may be delayed as a result of limited staffing during the pandemic.*  ___________________________________  FINAL CLINICAL IMPRESSION(S) / ED DIAGNOSES  Final diagnoses:  Polysubstance abuse (HCC)     MEDICATIONS GIVEN DURING THIS VISIT:  Medications - No data to display   ED Discharge Orders    None       Note:  This document was prepared using Dragon voice recognition software and may include unintentional dictation errors.   Darci Current, MD 05/13/19 [phone]
